# Patient Record
Sex: Female | Born: 1960 | Race: Black or African American | Hispanic: No | Marital: Married | Smoking: Never smoker
Health system: Southern US, Community
[De-identification: ages and names within clinical notes are randomized; demographics above are authoritative.]

## PROBLEM LIST (undated history)

## (undated) HISTORY — PX: ABDOMINAL HYSTERECTOMY: SHX81

---

## 2012-08-18 ENCOUNTER — Emergency Department: Payer: Self-pay | Admitting: Emergency Medicine

## 2012-08-18 LAB — CBC
HGB: 13.4 g/dL (ref 12.0–16.0)
MCV: 88 fL (ref 80–100)
Platelet: 280 10*3/uL (ref 150–440)
RBC: 4.43 10*6/uL (ref 3.80–5.20)
RDW: 13.8 % (ref 11.5–14.5)
WBC: 5.4 10*3/uL (ref 3.6–11.0)

## 2012-08-18 LAB — COMPREHENSIVE METABOLIC PANEL
Alkaline Phosphatase: 71 U/L (ref 50–136)
Anion Gap: 13 (ref 7–16)
BUN: 4 mg/dL — ABNORMAL LOW (ref 7–18)
Bilirubin,Total: 0.4 mg/dL (ref 0.2–1.0)
Chloride: 102 mmol/L (ref 98–107)
Co2: 23 mmol/L (ref 21–32)
EGFR (African American): >60
SGOT(AST): 29 U/L (ref 15–37)
SGPT (ALT): 37 U/L (ref 12–78)
Sodium: 138 mmol/L (ref 136–145)
Total Protein: 7.6 g/dL (ref 6.4–8.2)

## 2012-08-18 LAB — URINALYSIS, COMPLETE
Bilirubin,UR: NEGATIVE
Glucose,UR: NEGATIVE mg/dL (ref 0–75)
Leukocyte Esterase: NEGATIVE
Nitrite: NEGATIVE
Ph: 5 (ref 4.5–8.0)
RBC,UR: <1 /HPF (ref 0–5)
Specific Gravity: 1.029 (ref 1.003–1.030)
Squamous Epithelial: 32

## 2012-11-17 ENCOUNTER — Emergency Department: Payer: Self-pay | Admitting: Emergency Medicine

## 2012-11-17 LAB — URINALYSIS, COMPLETE
Bilirubin,UR: NEGATIVE
Glucose,UR: NEGATIVE mg/dL (ref 0–75)
Nitrite: NEGATIVE
Ph: 8 (ref 4.5–8.0)
Protein: NEGATIVE
RBC,UR: 1 /HPF (ref 0–5)
Squamous Epithelial: 10
WBC UR: 2 /HPF (ref 0–5)

## 2012-11-17 LAB — COMPREHENSIVE METABOLIC PANEL
Albumin: 3.5 g/dL (ref 3.4–5.0)
Alkaline Phosphatase: 83 U/L (ref 50–136)
Anion Gap: 7 (ref 7–16)
BUN: 6 mg/dL — ABNORMAL LOW (ref 7–18)
Calcium, Total: 9.1 mg/dL (ref 8.5–10.1)
Chloride: 103 mmol/L (ref 98–107)
Creatinine: 0.85 mg/dL (ref 0.60–1.30)
EGFR (African American): >60
EGFR (Non-African Amer.): >60
Glucose: 131 mg/dL — ABNORMAL HIGH (ref 65–99)
Osmolality: 271 (ref 275–301)
SGOT(AST): 27 U/L (ref 15–37)
SGPT (ALT): 23 U/L (ref 12–78)
Sodium: 136 mmol/L (ref 136–145)
Total Protein: 7.7 g/dL (ref 6.4–8.2)

## 2012-11-17 LAB — LIPASE, BLOOD: Lipase: 110 U/L (ref 73–393)

## 2012-11-17 LAB — CBC
HGB: 13.2 g/dL (ref 12.0–16.0)
MCH: 30.3 pg (ref 26.0–34.0)
RBC: 4.37 10*6/uL (ref 3.80–5.20)

## 2013-11-30 ENCOUNTER — Emergency Department: Payer: Self-pay | Admitting: Emergency Medicine

## 2013-11-30 LAB — COMPREHENSIVE METABOLIC PANEL
ALT: 21 U/L
ANION GAP: 8 (ref 7–16)
AST: 19 U/L (ref 15–37)
Albumin: 3.4 g/dL (ref 3.4–5.0)
Alkaline Phosphatase: 75 U/L
BUN: 16 mg/dL (ref 7–18)
Bilirubin,Total: 0.4 mg/dL (ref 0.2–1.0)
CREATININE: 0.79 mg/dL (ref 0.60–1.30)
Calcium, Total: 8.2 mg/dL — ABNORMAL LOW (ref 8.5–10.1)
Chloride: 104 mmol/L (ref 98–107)
Co2: 27 mmol/L (ref 21–32)
EGFR (African American): >60
Glucose: 104 mg/dL — ABNORMAL HIGH (ref 65–99)
Osmolality: 279 (ref 275–301)
POTASSIUM: 3.7 mmol/L (ref 3.5–5.1)
Sodium: 139 mmol/L (ref 136–145)
Total Protein: 7.8 g/dL (ref 6.4–8.2)

## 2013-11-30 LAB — LIPASE, BLOOD: Lipase: 111 U/L (ref 73–393)

## 2013-11-30 LAB — CBC WITH DIFFERENTIAL/PLATELET
Basophil #: 0 10*3/uL (ref 0.0–0.1)
Basophil %: 0.4 %
EOS ABS: 0 10*3/uL (ref 0.0–0.7)
Eosinophil %: 0 %
HCT: 40.2 % (ref 35.0–47.0)
HGB: 13.1 g/dL (ref 12.0–16.0)
Lymphocyte #: 1.2 10*3/uL (ref 1.0–3.6)
Lymphocyte %: 19 %
MCH: 29.4 pg (ref 26.0–34.0)
MCHC: 32.6 g/dL (ref 32.0–36.0)
MCV: 90 fL (ref 80–100)
Monocyte #: 0.4 x10 3/mm (ref 0.2–0.9)
Monocyte %: 5.9 %
Neutrophil #: 4.6 10*3/uL (ref 1.4–6.5)
Neutrophil %: 74.7 %
Platelet: 255 10*3/uL (ref 150–440)
RBC: 4.46 10*6/uL (ref 3.80–5.20)
RDW: 13.5 % (ref 11.5–14.5)
WBC: 6.2 10*3/uL (ref 3.6–11.0)

## 2013-12-15 ENCOUNTER — Emergency Department: Payer: Self-pay | Admitting: Student

## 2013-12-15 LAB — URINALYSIS, COMPLETE
Bilirubin,UR: NEGATIVE
GLUCOSE, UR: NEGATIVE mg/dL (ref 0–75)
Nitrite: NEGATIVE
PH: 5 (ref 4.5–8.0)
Protein: 100
RBC,UR: 10 /HPF (ref 0–5)
Specific Gravity: 1.036 (ref 1.003–1.030)
Squamous Epithelial: 14
WBC UR: 19 /HPF (ref 0–5)

## 2013-12-15 LAB — CBC WITH DIFFERENTIAL/PLATELET
Basophil #: 0 10*3/uL (ref 0.0–0.1)
Basophil %: 0.6 %
EOS PCT: 0 %
Eosinophil #: 0 10*3/uL (ref 0.0–0.7)
HCT: 42.7 % (ref 35.0–47.0)
HGB: 13.8 g/dL (ref 12.0–16.0)
LYMPHS ABS: 1.2 10*3/uL (ref 1.0–3.6)
Lymphocyte %: 18.1 %
MCH: 28.9 pg (ref 26.0–34.0)
MCHC: 32.2 g/dL (ref 32.0–36.0)
MCV: 90 fL (ref 80–100)
MONO ABS: 0.5 x10 3/mm (ref 0.2–0.9)
Monocyte %: 6.8 %
Neutrophil #: 5 10*3/uL (ref 1.4–6.5)
Neutrophil %: 74.5 %
Platelet: 310 10*3/uL (ref 150–440)
RBC: 4.77 10*6/uL (ref 3.80–5.20)
RDW: 13.5 % (ref 11.5–14.5)
WBC: 6.7 10*3/uL (ref 3.6–11.0)

## 2013-12-15 LAB — COMPREHENSIVE METABOLIC PANEL
ALBUMIN: 3.6 g/dL (ref 3.4–5.0)
ALK PHOS: 76 U/L
AST: 19 U/L (ref 15–37)
Anion Gap: 12 (ref 7–16)
BILIRUBIN TOTAL: 0.4 mg/dL (ref 0.2–1.0)
BUN: 17 mg/dL (ref 7–18)
CALCIUM: 8.9 mg/dL (ref 8.5–10.1)
CHLORIDE: 98 mmol/L (ref 98–107)
CO2: 26 mmol/L (ref 21–32)
Creatinine: 0.83 mg/dL (ref 0.60–1.30)
EGFR (African American): >60
Glucose: 99 mg/dL (ref 65–99)
OSMOLALITY: 274 (ref 275–301)
POTASSIUM: 3.5 mmol/L (ref 3.5–5.1)
SGPT (ALT): 15 U/L
Sodium: 136 mmol/L (ref 136–145)
Total Protein: 8.4 g/dL — ABNORMAL HIGH (ref 6.4–8.2)

## 2013-12-15 LAB — LIPASE, BLOOD: Lipase: 117 U/L (ref 73–393)

## 2013-12-17 LAB — URINE CULTURE

## 2016-07-15 ENCOUNTER — Encounter: Payer: Self-pay | Admitting: Emergency Medicine

## 2016-07-15 ENCOUNTER — Emergency Department
Admission: EM | Admit: 2016-07-15 | Discharge: 2016-07-15 | Disposition: A | Discharge status: Home / Self Care | Payer: BLUE CROSS/BLUE SHIELD | Attending: Emergency Medicine | Admitting: Emergency Medicine

## 2016-07-15 ENCOUNTER — Emergency Department: Payer: BLUE CROSS/BLUE SHIELD

## 2016-07-15 DIAGNOSIS — M545 Low back pain: Secondary | ICD-10-CM | POA: Diagnosis present

## 2016-07-15 DIAGNOSIS — N3090 Cystitis, unspecified without hematuria: Secondary | ICD-10-CM

## 2016-07-15 DIAGNOSIS — N3000 Acute cystitis without hematuria: Secondary | ICD-10-CM | POA: Insufficient documentation

## 2016-07-15 LAB — CBC
HEMATOCRIT: 38.2 % (ref 35.0–47.0)
HEMOGLOBIN: 13.1 g/dL (ref 12.0–16.0)
MCH: 31.1 pg (ref 26.0–34.0)
MCHC: 34.3 g/dL (ref 32.0–36.0)
MCV: 90.8 fL (ref 80.0–100.0)
Platelets: 277 10*3/uL (ref 150–440)
RBC: 4.21 MIL/uL (ref 3.80–5.20)
RDW: 13.4 % (ref 11.5–14.5)
WBC: 5.3 10*3/uL (ref 3.6–11.0)

## 2016-07-15 LAB — COMPREHENSIVE METABOLIC PANEL
ALBUMIN: 3.8 g/dL (ref 3.5–5.0)
ALT: 14 U/L (ref 14–54)
ANION GAP: 10 (ref 5–15)
AST: 19 U/L (ref 15–41)
Alkaline Phosphatase: 48 U/L (ref 38–126)
BUN: 7 mg/dL (ref 6–20)
CHLORIDE: 103 mmol/L (ref 101–111)
CO2: 25 mmol/L (ref 22–32)
Calcium: 8.9 mg/dL (ref 8.9–10.3)
Creatinine, Ser: 0.7 mg/dL (ref 0.44–1.00)
GFR calc non Af Amer: >60 mL/min (ref >60)
GLUCOSE: 109 mg/dL — AB (ref 65–99)
POTASSIUM: 3.5 mmol/L (ref 3.5–5.1)
SODIUM: 138 mmol/L (ref 135–145)
Total Bilirubin: 0.8 mg/dL (ref 0.3–1.2)
Total Protein: 7.4 g/dL (ref 6.5–8.1)

## 2016-07-15 LAB — URINALYSIS, COMPLETE (UACMP) WITH MICROSCOPIC
Bilirubin Urine: NEGATIVE
GLUCOSE, UA: NEGATIVE mg/dL
KETONES UR: 20 mg/dL — AB
Leukocytes, UA: NEGATIVE
Nitrite: NEGATIVE
PROTEIN: 100 mg/dL — AB
Specific Gravity, Urine: 1.028 (ref 1.005–1.030)
pH: 5 (ref 5.0–8.0)

## 2016-07-15 LAB — LIPASE, BLOOD: LIPASE: 27 U/L (ref 11–51)

## 2016-07-15 MED ORDER — MORPHINE SULFATE (PF) 4 MG/ML IV SOLN
4.0000 mg | Freq: Once | INTRAVENOUS | Status: AC
  Administered 2016-07-15: 4 mg via INTRAVENOUS
  Filled 2016-07-15: qty 1

## 2016-07-15 MED ORDER — DEXTROSE 5 % IV SOLN
1.0000 g | Freq: Once | INTRAVENOUS | Status: AC
  Administered 2016-07-15: 1 g via INTRAVENOUS
  Filled 2016-07-15: qty 10

## 2016-07-15 MED ORDER — PHENAZOPYRIDINE HCL 200 MG PO TABS
200.0000 mg | ORAL_TABLET | Freq: Once | ORAL | Status: AC
  Administered 2016-07-15: 200 mg via ORAL
  Filled 2016-07-15: qty 1

## 2016-07-15 MED ORDER — SODIUM CHLORIDE 0.9 % IV SOLN
Freq: Once | INTRAVENOUS | Status: AC
  Administered 2016-07-15: 08:00:00 via INTRAVENOUS

## 2016-07-15 MED ORDER — PHENAZOPYRIDINE HCL 200 MG PO TABS: 200.0000 mg | ORAL_TABLET | Freq: Three times a day (TID) | ORAL | 0 refills | Status: AC | PRN

## 2016-07-15 MED ORDER — CIPROFLOXACIN HCL 500 MG PO TABS: 500.0000 mg | ORAL_TABLET | Freq: Two times a day (BID) | ORAL | 0 refills | Status: AC

## 2016-07-15 MED ORDER — KETOROLAC TROMETHAMINE 30 MG/ML IJ SOLN
30.0000 mg | Freq: Once | INTRAMUSCULAR | Status: AC
  Administered 2016-07-15: 30 mg via INTRAVENOUS
  Filled 2016-07-15: qty 1

## 2016-07-15 MED ORDER — ONDANSETRON HCL 4 MG/2ML IJ SOLN
4.0000 mg | Freq: Once | INTRAMUSCULAR | Status: AC
  Administered 2016-07-15: 4 mg via INTRAVENOUS
  Filled 2016-07-15: qty 2

## 2016-07-15 MED ORDER — TRAMADOL HCL 50 MG PO TABS: 50.0000 mg | ORAL_TABLET | Freq: Four times a day (QID) | ORAL | 0 refills | Status: AC | PRN

## 2016-07-15 NOTE — ED Triage Notes (Signed)
Pt presents to ED with c/o lower back pain and lower abd pain for the past week. Also reports urinary frequency. Hx of the same and reports las time her symptoms were due to a UTI. Denies fever or vomiting.

## 2016-07-15 NOTE — ED Provider Notes (Signed)
[facility] Emergency Department Provider Note       Time seen: ----------------------------------------- 7:48 AM on 07/15/2016 -----------------------------------------     I have reviewed the triage vital signs and the nursing notes.   HISTORY   Chief Complaint Abdominal Pain and Back Pain    HPI Pura SpiceBonita Schuff is a 56 y.o. female who presents to the ED for lower back pain as well as lower abdominal pain and dysuria for the past week. Patient has urinary frequency as well, states last time she felt this way she had a UTI. She denies fever but has had chills, has had nausea but no vomiting. She denies other complaints at this time.   History reviewed. No pertinent past medical history.  There are no active problems to display for this patient.   History reviewed. No pertinent surgical history.  Allergies Patient has no known allergies.  Social History Social History  Substance Use Topics  . Smoking status: Never Smoker  . Smokeless tobacco: Never Used  . Alcohol use No    Review of Systems Constitutional: Negative for fever.Positive for chills Cardiovascular: Negative for chest pain. Respiratory: Negative for shortness of breath. Gastrointestinal: Positive for abdominal pain, positive for nausea Genitourinary: Negative for dysuria. Musculoskeletal: Positive for back pain Skin: Negative for rash. Neurological: Negative for headaches, focal weakness or numbness.  All systems negative/normal/unremarkable except as stated in the HPI  ____________________________________________   PHYSICAL EXAM:  VITAL SIGNS: ED Triage Vitals  Enc Vitals Group     BP 07/15/16 0651 117/83     Pulse Rate 07/15/16 0651 (!) 108     Resp 07/15/16 0651 18     Temp 07/15/16 0651 98.8 F (37.1 C)     Temp Source 07/15/16 0651 Oral     SpO2 07/15/16 0651 98 %     Weight 07/15/16 0652 180 lb (81.6 kg)     Height 07/15/16 0652 5\' 5"  (1.651 m)     Head  Circumference --      Peak Flow --      Pain Score 07/15/16 0651 6     Pain Loc --      Pain Edu? --      Excl. in GC? --     Constitutional: Alert and oriented. Well appearing and in no distress. Eyes: Conjunctivae are normal. Normal extraocular movements. ENT   Head: Normocephalic and atraumatic.   Nose: No congestion/rhinnorhea.   Mouth/Throat: Mucous membranes are moist.   Neck: No stridor. Cardiovascular: Normal rate, regular rhythm. No murmurs, rubs, or gallops. Respiratory: Normal respiratory effort without tachypnea nor retractions. Breath sounds are clear and equal bilaterally. No wheezes/rales/rhonchi. Gastrointestinal: Soft and nontender. Normal bowel sounds. No specific CVA tenderness Musculoskeletal: Nontender with normal range of motion in extremities. No lower extremity tenderness nor edema. Neurologic:  Normal speech and language. No gross focal neurologic deficits are appreciated.  Skin:  Skin is warm, dry and intact. No rash noted. Psychiatric: Mood and affect are normal. Speech and behavior are normal.  ____________________________________________  ED COURSE:  Pertinent labs & imaging results that were available during my care of the patient were reviewed by me and considered in my medical decision making (see chart for details). Patient presents for abdominal pain as well as back pain, we will assess with labs and imaging as indicated.   Procedures ____________________________________________   LABS (pertinent positives/negatives)  Labs Reviewed  COMPREHENSIVE METABOLIC PANEL - Abnormal; Notable for the following:  Result Value   Glucose, Bld 109 (*)    All other components within normal limits  URINALYSIS, COMPLETE (UACMP) WITH MICROSCOPIC - Abnormal; Notable for the following:    Color, Urine AMBER (*)    APPearance CLOUDY (*)    Hgb urine dipstick LARGE (*)    Ketones, ur 20 (*)    Protein, ur 100 (*)    All other components within  normal limits  LIPASE, BLOOD  CBC    RADIOLOGY Images reviewed by me Renal ultrasound IMPRESSION: 1. Suggestion of a 3.5 mm nonobstructive stone in the left kidney. No other acute abnormalities. ____________________________________________  FINAL ASSESSMENT AND PLAN  Cystitis  Plan: Patient's labs and imaging were dictated above. Patient had presented for back pain and symptoms of a UTI. No hydronephrosis or extra renal stone was visualized on ultrasound. We have started her on IV antibiotics and sent a urine culture. She is stable for outpatient follow-up.   Emily Filbert, MD   Note: This note was generated in part or whole with voice recognition software. Voice recognition is usually quite accurate but there are transcription errors that can and very often do occur. I apologize for any typographical errors that were not detected and corrected.     Emily Filbert, MD 07/15/16 [phone]

## 2016-07-16 LAB — URINE CULTURE: Special Requests: NORMAL

## 2016-08-13 ENCOUNTER — Emergency Department
Admission: EM | Admit: 2016-08-13 | Discharge: 2016-08-13 | Disposition: A | Discharge status: Home / Self Care | Payer: BLUE CROSS/BLUE SHIELD | Attending: Emergency Medicine | Admitting: Emergency Medicine

## 2016-08-13 ENCOUNTER — Encounter: Payer: Self-pay | Admitting: Emergency Medicine

## 2016-08-13 DIAGNOSIS — R1030 Lower abdominal pain, unspecified: Secondary | ICD-10-CM | POA: Insufficient documentation

## 2016-08-13 DIAGNOSIS — R112 Nausea with vomiting, unspecified: Secondary | ICD-10-CM | POA: Diagnosis not present

## 2016-08-13 DIAGNOSIS — Z5321 Procedure and treatment not carried out due to patient leaving prior to being seen by health care provider: Secondary | ICD-10-CM | POA: Diagnosis not present

## 2016-08-13 LAB — CBC
HEMATOCRIT: 38.8 % (ref 35.0–47.0)
HEMOGLOBIN: 13.1 g/dL (ref 12.0–16.0)
MCH: 30.7 pg (ref 26.0–34.0)
MCHC: 33.9 g/dL (ref 32.0–36.0)
MCV: 90.5 fL (ref 80.0–100.0)
Platelets: 269 10*3/uL (ref 150–440)
RBC: 4.29 MIL/uL (ref 3.80–5.20)
RDW: 12.9 % (ref 11.5–14.5)
WBC: 4.8 10*3/uL (ref 3.6–11.0)

## 2016-08-13 LAB — URINALYSIS, COMPLETE (UACMP) WITH MICROSCOPIC
Bilirubin Urine: NEGATIVE
GLUCOSE, UA: NEGATIVE mg/dL
KETONES UR: NEGATIVE mg/dL
Leukocytes, UA: NEGATIVE
NITRITE: NEGATIVE
PROTEIN: 30 mg/dL — AB
Specific Gravity, Urine: 1.016 (ref 1.005–1.030)
pH: 8 (ref 5.0–8.0)

## 2016-08-13 LAB — COMPREHENSIVE METABOLIC PANEL
ALBUMIN: 3.5 g/dL (ref 3.5–5.0)
ALK PHOS: 52 U/L (ref 38–126)
ALT: 11 U/L — ABNORMAL LOW (ref 14–54)
ANION GAP: 9 (ref 5–15)
AST: 17 U/L (ref 15–41)
BUN: 6 mg/dL (ref 6–20)
CALCIUM: 9.1 mg/dL (ref 8.9–10.3)
CO2: 26 mmol/L (ref 22–32)
Chloride: 102 mmol/L (ref 101–111)
Creatinine, Ser: 0.5 mg/dL (ref 0.44–1.00)
GFR calc non Af Amer: >60 mL/min (ref >60)
GLUCOSE: 117 mg/dL — AB (ref 65–99)
POTASSIUM: 3.5 mmol/L (ref 3.5–5.1)
SODIUM: 137 mmol/L (ref 135–145)
Total Bilirubin: 0.5 mg/dL (ref 0.3–1.2)
Total Protein: 7.2 g/dL (ref 6.5–8.1)

## 2016-08-13 LAB — LIPASE, BLOOD: LIPASE: 49 U/L (ref 11–51)

## 2016-08-13 NOTE — ED Triage Notes (Signed)
Pt to ED c/o bilateral lower abd pain. Pt states that the pain started today. Pain is similar to the pain she had 4 weeks ago when she was diagnosed with a UTI. Pt states that she is also having N/V.

## 2016-08-15 ENCOUNTER — Telehealth: Payer: Self-pay | Admitting: Emergency Medicine

## 2016-08-15 NOTE — Telephone Encounter (Signed)
Called patient due to lwot to inquire about condition and follow up plans. Left message.   

## 2016-08-19 ENCOUNTER — Encounter: Payer: Self-pay | Admitting: Radiology

## 2016-08-19 ENCOUNTER — Emergency Department
Admission: EM | Admit: 2016-08-19 | Discharge: 2016-08-19 | Disposition: A | Discharge status: Home / Self Care | Payer: BLUE CROSS/BLUE SHIELD | Attending: Emergency Medicine | Admitting: Emergency Medicine

## 2016-08-19 ENCOUNTER — Emergency Department: Payer: BLUE CROSS/BLUE SHIELD

## 2016-08-19 DIAGNOSIS — R112 Nausea with vomiting, unspecified: Secondary | ICD-10-CM | POA: Insufficient documentation

## 2016-08-19 DIAGNOSIS — N3091 Cystitis, unspecified with hematuria: Secondary | ICD-10-CM | POA: Diagnosis not present

## 2016-08-19 DIAGNOSIS — R1030 Lower abdominal pain, unspecified: Secondary | ICD-10-CM | POA: Insufficient documentation

## 2016-08-19 DIAGNOSIS — N3090 Cystitis, unspecified without hematuria: Secondary | ICD-10-CM

## 2016-08-19 LAB — URINALYSIS, COMPLETE (UACMP) WITH MICROSCOPIC
Bilirubin Urine: NEGATIVE
Glucose, UA: NEGATIVE mg/dL
Ketones, ur: NEGATIVE mg/dL
Leukocytes, UA: NEGATIVE
Nitrite: NEGATIVE
Protein, ur: NEGATIVE mg/dL
SPECIFIC GRAVITY, URINE: 1.01 (ref 1.005–1.030)
pH: 7 (ref 5.0–8.0)

## 2016-08-19 LAB — COMPREHENSIVE METABOLIC PANEL
ALBUMIN: 3.7 g/dL (ref 3.5–5.0)
ALT: 11 U/L — ABNORMAL LOW (ref 14–54)
AST: 16 U/L (ref 15–41)
Alkaline Phosphatase: 63 U/L (ref 38–126)
Anion gap: 8 (ref 5–15)
BILIRUBIN TOTAL: 0.4 mg/dL (ref 0.3–1.2)
BUN: 7 mg/dL (ref 6–20)
CHLORIDE: 104 mmol/L (ref 101–111)
CO2: 26 mmol/L (ref 22–32)
Calcium: 8.9 mg/dL (ref 8.9–10.3)
Creatinine, Ser: 0.67 mg/dL (ref 0.44–1.00)
GFR calc Af Amer: >60 mL/min (ref >60)
GFR calc non Af Amer: >60 mL/min (ref >60)
GLUCOSE: 106 mg/dL — AB (ref 65–99)
POTASSIUM: 3.7 mmol/L (ref 3.5–5.1)
SODIUM: 138 mmol/L (ref 135–145)
Total Protein: 7.2 g/dL (ref 6.5–8.1)

## 2016-08-19 LAB — CBC
HEMATOCRIT: 38.1 % (ref 35.0–47.0)
HEMOGLOBIN: 12.9 g/dL (ref 12.0–16.0)
MCH: 30 pg (ref 26.0–34.0)
MCHC: 33.8 g/dL (ref 32.0–36.0)
MCV: 88.9 fL (ref 80.0–100.0)
Platelets: 260 10*3/uL (ref 150–440)
RBC: 4.29 MIL/uL (ref 3.80–5.20)
RDW: 13.4 % (ref 11.5–14.5)
WBC: 4 10*3/uL (ref 3.6–11.0)

## 2016-08-19 MED ORDER — MORPHINE SULFATE (PF) 4 MG/ML IV SOLN
4.0000 mg | Freq: Once | INTRAVENOUS | Status: AC
  Administered 2016-08-19: 4 mg via INTRAVENOUS
  Filled 2016-08-19: qty 1

## 2016-08-19 MED ORDER — SODIUM CHLORIDE 0.9 % IV BOLUS (SEPSIS)
1000.0000 mL | Freq: Once | INTRAVENOUS | Status: AC
  Administered 2016-08-19: 1000 mL via INTRAVENOUS

## 2016-08-19 MED ORDER — CEPHALEXIN 500 MG PO CAPS: 500.0000 mg | ORAL_CAPSULE | Freq: Two times a day (BID) | ORAL | 0 refills | Status: AC

## 2016-08-19 MED ORDER — ONDANSETRON HCL 4 MG/2ML IJ SOLN
4.0000 mg | Freq: Once | INTRAMUSCULAR | Status: AC
  Administered 2016-08-19: 4 mg via INTRAVENOUS
  Filled 2016-08-19: qty 2

## 2016-08-19 MED ORDER — IOPAMIDOL (ISOVUE-300) INJECTION 61%
30.0000 mL | Freq: Once | INTRAVENOUS | Status: AC | PRN
  Administered 2016-08-19: 30 mL via ORAL

## 2016-08-19 MED ORDER — IOPAMIDOL (ISOVUE-300) INJECTION 61%
100.0000 mL | Freq: Once | INTRAVENOUS | Status: AC | PRN
  Administered 2016-08-19: 100 mL via INTRAVENOUS

## 2016-08-19 MED ORDER — ONDANSETRON 4 MG PO TBDP: 4.0000 mg | ORAL_TABLET | Freq: Three times a day (TID) | ORAL | 0 refills | Status: AC | PRN

## 2016-08-19 NOTE — ED Notes (Signed)
Patient verbalizes understanding of d/c instructions and follow-up. VS stable and pain controlled per patient.  Patient in NAD at time of d/c and denies further concerns regarding this visit. Patient stable at the time of departure from the unit, departing unit by the safest and most appropriate manner per that patients condition and limitations. Patient advised to return to the ED at any time for emergent concerns, or for new/worsening symptoms.    Patient refused wheel chair out  

## 2016-08-19 NOTE — ED Triage Notes (Signed)
Pt states bilateral lower quadrant pain that radiates to lower back for a week. Pt states she has had nausea, no known fever, no diarrhea. Pt states she was seen a week ago for upper quadrant abd pain, but left before diagnosis.

## 2016-08-19 NOTE — ED Provider Notes (Signed)
[facility] Emergency Department Provider Note  Time seen: 7:48 AM  I have reviewed the triage vital signs and the nursing notes.   HISTORY  Chief Complaint Abdominal Pain and Back Pain    HPI Sabrina Mccann is a 56 y.o. female who presents to the emergency department for lower abdominal pain nausea and vomiting. According to the patient for the past one month she has been extremity intermittent lower abdominal pain. She was seen here 1 month ago for the same was diagnosed with possible urinary tract infection and was discharged on antibiotics. Patient states her discomfort went away however approximate one or 2 weeks ago it returned along with nausea and vomiting this morning. Patient describes her lower abdominal pain as an 8/10. Denies any dysuria but states she has noticed occasional blood in her urine. She states the lower abdominal pain radiates to her lower back. Denies any lateralizing symptoms, states her pain is in the midline. Denies any weakness or numbness. Denies any fever. Denies any chest pain or shortness of breath. Patient currently describes the pain as an 8/10 dull aching pain in her lower abdomen.  No past medical history on file.  There are no active problems to display for this patient.   No past surgical history on file.  Prior to Admission medications   Medication Sig Start Date End Date Taking? Authorizing Provider  phenazopyridine (PYRIDIUM) 200 MG tablet Take 1 tablet (200 mg total) by mouth 3 (three) times daily as needed for pain. 07/15/16 07/15/17  Emily Filbert, MD  traMADol (ULTRAM) 50 MG tablet Take 1 tablet (50 mg total) by mouth every 6 (six) hours as needed. 07/15/16 07/15/17  Emily Filbert, MD    No Known Allergies  No family history on file.  Social History Social History  Substance Use Topics  . Smoking status: Never Smoker  . Smokeless tobacco: Never Used  . Alcohol use No    Review of  Systems Constitutional: Negative for fever. Cardiovascular: Negative for chest pain. Respiratory: Negative for shortness of breath. Gastrointestinal: Lower abdominal pain. Positive for nausea and vomiting. Negative for diarrhea. Genitourinary: Negative for dysuria. States occasional hematuria. Denies vaginal bleeding or discharge. Musculoskeletal: Lower back pain. Neurological: Negative for headache All other ROS negative  ____________________________________________   PHYSICAL EXAM:  VITAL SIGNS: ED Triage Vitals [08/19/16 0650]  Enc Vitals Group     BP (!) 146/77     Pulse Rate (!) 112     Resp 18     Temp 99 F (37.2 C)     Temp src      SpO2 100 %     Weight 180 lb (81.6 kg)     Height 5\' 5"  (1.651 m)     Head Circumference      Peak Flow      Pain Score 8     Pain Loc      Pain Edu?      Excl. in GC?     Constitutional: Alert and oriented. Well appearing and in no distress. Eyes: Normal exam ENT   Head: Normocephalic and atraumatic.   Mouth/Throat: Mucous membranes are moist. Cardiovascular: Normal rate, regular rhythm. No murmur Respiratory: Normal respiratory effort without tachypnea nor retractions. Breath sounds are clear  Gastrointestinal: Soft, moderate suprapubic tenderness palpation. No rebound or guarding. No distention. Musculoskeletal: Nontender with normal range of motion in all extremities.  Neurologic:  Normal speech and language. No gross focal neurologic deficits  Skin:  Skin is warm, dry and intact.  Psychiatric: Mood and affect are normal.  ____________________________________________     RADIOLOGY  IMPRESSION: Suggestion of mild gallbladder sludge versus subtle gallstones with mild ill definition of the gallbladder wall without adjacent fluid or inflammatory change. Consider right upper quadrant ultrasound for further evaluation.  Subcentimeter hypodensity over the right kidney too small to characterize, but likely a  cyst.  ____________________________________________   INITIAL IMPRESSION / ASSESSMENT AND PLAN / ED COURSE  Pertinent labs & imaging results that were available during my care of the patient were reviewed by me and considered in my medical decision making (see chart for details).  Patient presents to the emergency department for lower abdominal discomfort and nausea with vomiting this morning. Patient does have moderate suprapubic tenderness to palpation. Patient's labs are largely within normal limits including a normal white blood cell count. Urinalysis does shows 6-30 RBCs but no WBCs. We will send a urine culture. We'll obtain a CT scan of the abdomen/pelvis to further evaluate.  Patient's CT shows gallbladder sludge. I discussed this with the patient however given her normal LFTs and S her pain is in the lower abdomen I doubt this is related to her current complaint. However patient would like it evaluated we will proceed with an ultrasound to further evaluate. Given the patient's continued mild hematuria we will likely cover his urinary tract infection with Keflex. Patient was last placed on ciprofloxacin. I sent a urine culture. Patient agreeable to this plan of care.  Ultrasound shows small stones and sludge with no accompanying factors. Patient will be discharged home with Keflex and PCP follow-up. Patient agreeable to plan.  ____________________________________________   FINAL CLINICAL IMPRESSION(S) / ED DIAGNOSES  Lower abdominal pain Cystitis   Minna AntisPaduchowski, Sanjeev Main, MD 08/19/16 1133

## 2016-08-21 LAB — URINE CULTURE

## 2019-01-21 IMAGING — CT CT ABD-PELV W/ CM
2 of 5 series · 15 of 46 positions shown, 17 images · IV contrast (APPLIED)
Comparison: None.

CLINICAL DATA: Abdominal/ pelvic pain 6 months with nausea and
vomiting with symptoms worse over the past week.

EXAM:
CT ABDOMEN AND PELVIS WITH CONTRAST
TECHNIQUE: Multidetector CT imaging of the abdomen and pelvis was performed
using the standard protocol following bolus administration of
intravenous contrast.
CONTRAST:  100mL [id number] IOPAMIDOL ([id number]) INJECTION 61%

[Series 2: routine abd/pel with · axial · 0.70mm/px · z∈[-830,-375]mm · 12 of 103 slices shown, 14 images]
[im 6/103  soft-tissue]
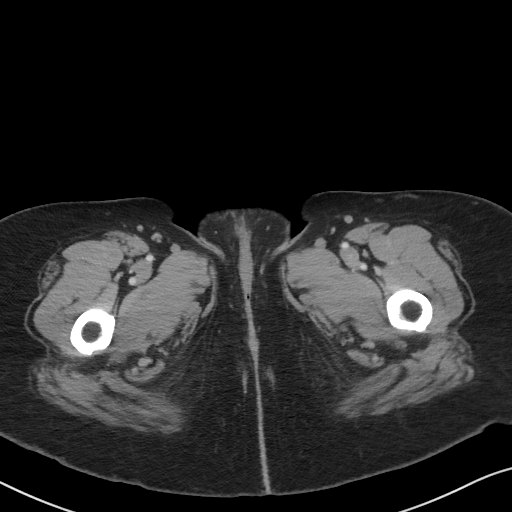
[im 6/103  bone]
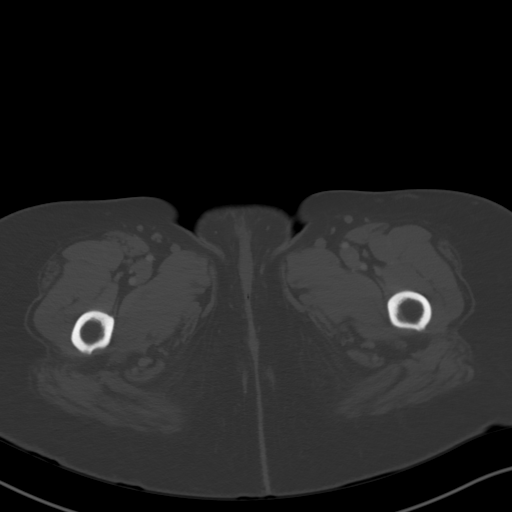
[im 18/103  soft-tissue]
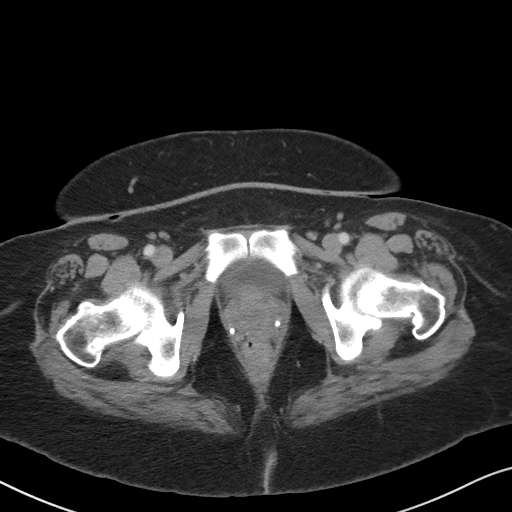
[im 23/103  soft-tissue]
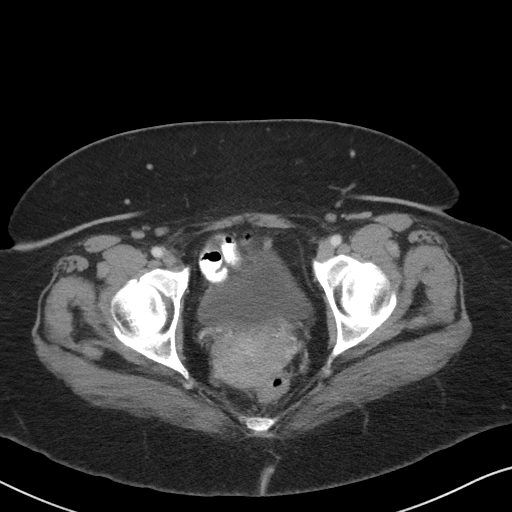
[im 29/103  soft-tissue]
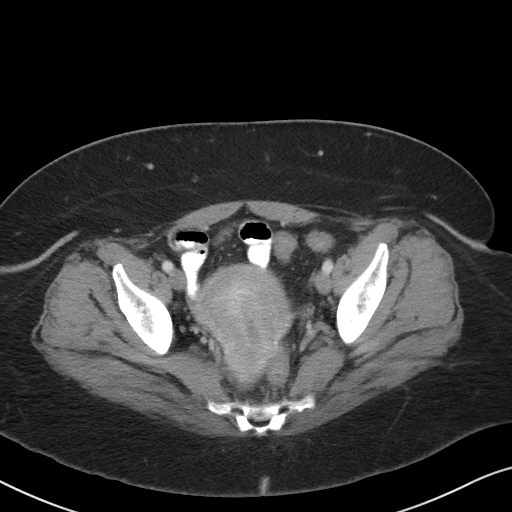
[im 40/103  soft-tissue]
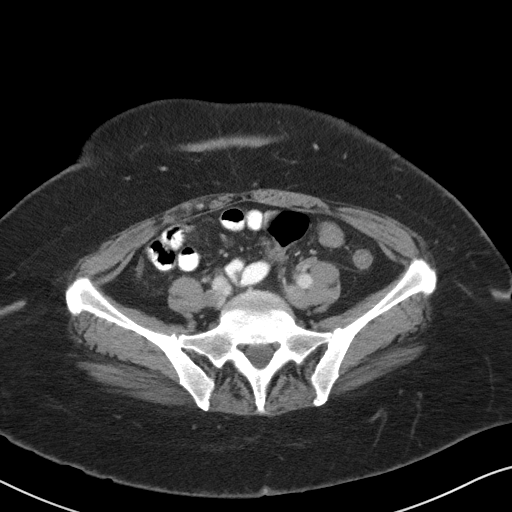
[im 46/103  soft-tissue]
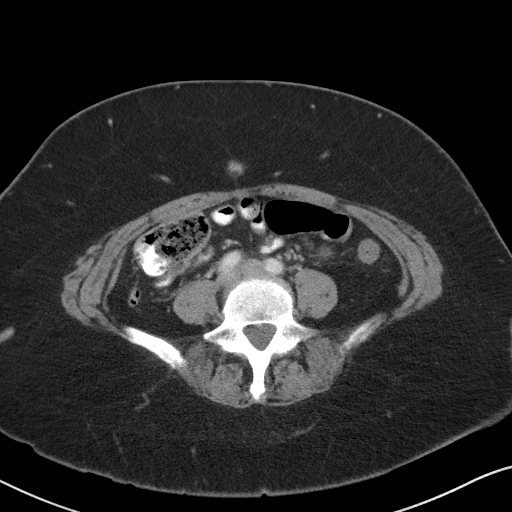
[im 57/103  soft-tissue]
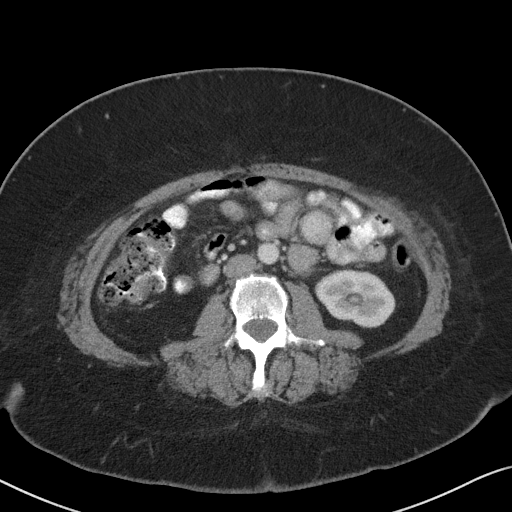
[im 63/103  soft-tissue]
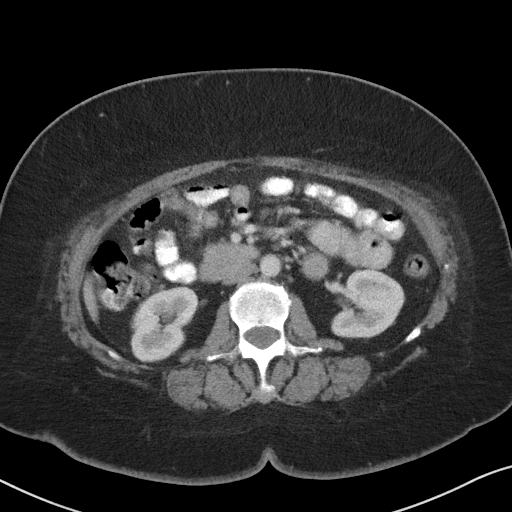
[im 74/103  soft-tissue]
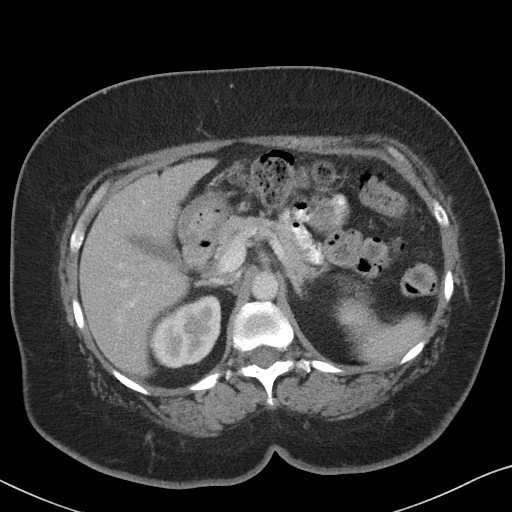
[im 74/103  bone]
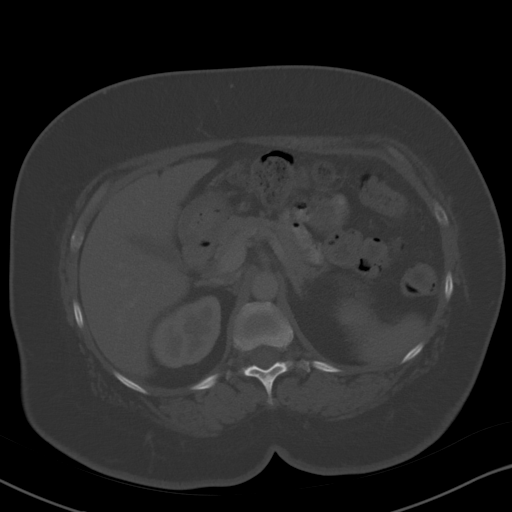
[im 80/103  soft-tissue]
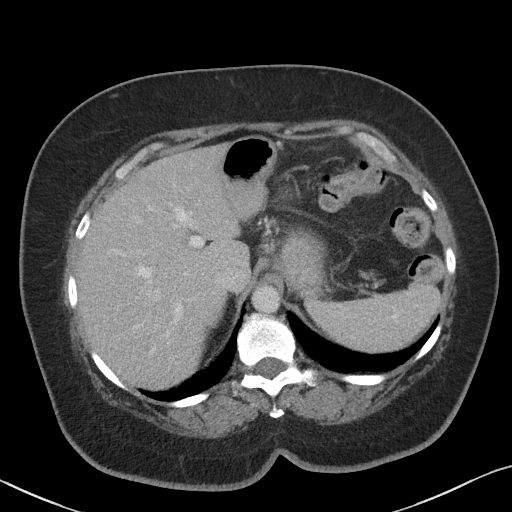
[im 86/103  soft-tissue]
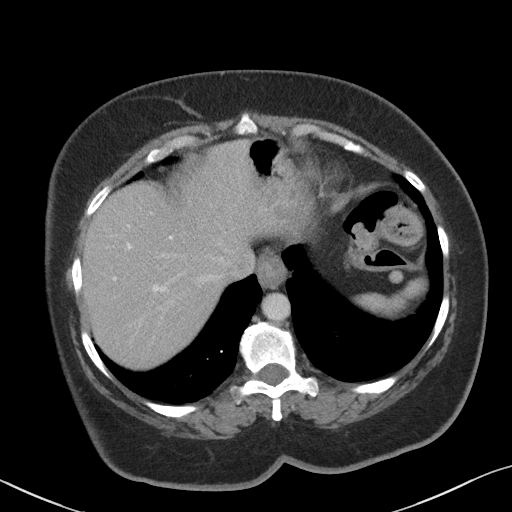
[im 97/103  soft-tissue]
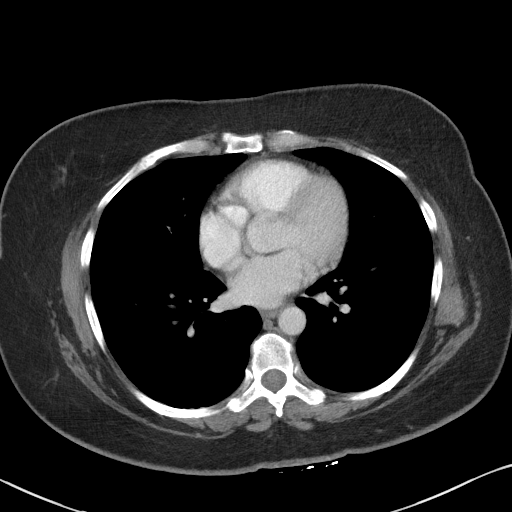

[Series 5: coronal st · coronal · 0.69mm/px · 3 of 90 slices shown]
[im 30/90  soft-tissue]
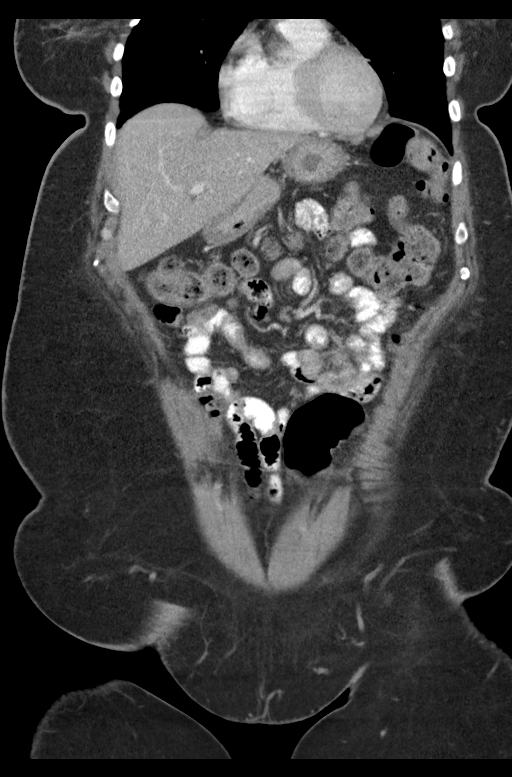
[im 40/90  soft-tissue]
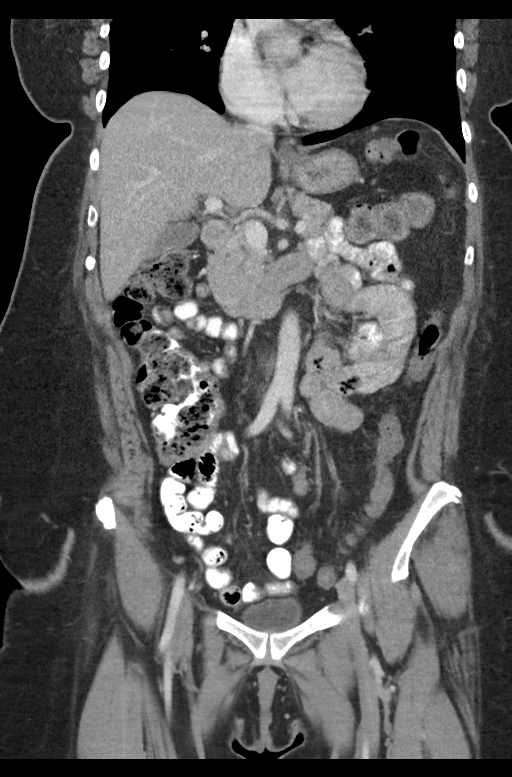
[im 50/90  soft-tissue]
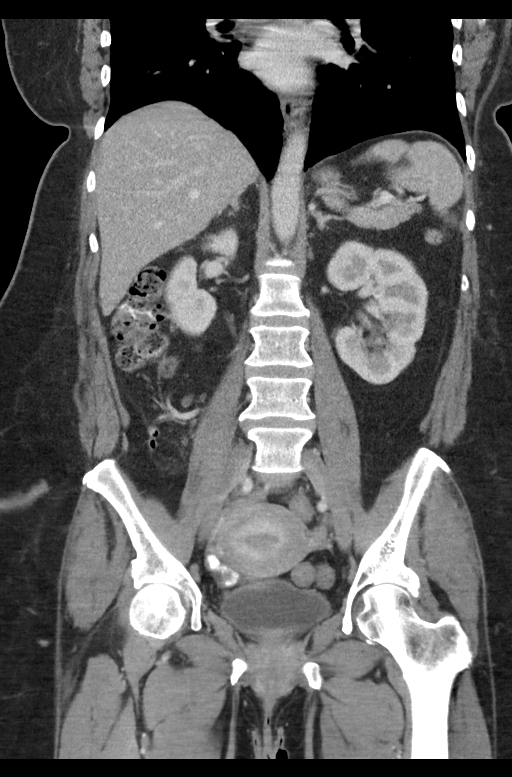

[15 of 46 positions shown; findings below may reference images not displayed]

FINDINGS: Lower chest: Within normal.

Hepatobiliary: Possible sludge versus subtle cholelithiasis. Subtle
gallbladder wall ill definition without adjacent inflammatory change
or fluid. Liver and biliary tree are normal.

Pancreas: Normal.

Spleen: Normal.

Adrenals/Urinary Tract: Adrenal glands are normal. Kidneys are
normal in size without hydronephrosis or nephrolithiasis.
Subcentimeter hypodensity over the anterior mid pole of the right
kidney too small to characterize but likely a cyst. Ureters and
bladder are normal.

Stomach/Bowel: Stomach and small bowel are within normal. Appendix
is normal. Distal descending and rectosigmoid colon is predominately
decompressed.

Vascular/Lymphatic: Within normal.

Reproductive: Within normal.

Other: Tiny amount of free fluid in the cul-de-sac. No focal
inflammatory change. Mesentery within normal.

Musculoskeletal: Minimal degenerate change of the spine.
IMPRESSION: Suggestion of mild gallbladder sludge versus subtle gallstones with
mild ill definition of the gallbladder wall without adjacent fluid
or inflammatory change. Consider right upper quadrant ultrasound for
further evaluation.

Subcentimeter hypodensity over the right kidney too small to
characterize, but likely a cyst.

## 2019-01-29 IMAGING — US US ABDOMEN LIMITED
1 series · 14 of 25 positions shown · non-contrast
Comparison: CT from earlier in the same day.

CLINICAL DATA: Abdominal pain

EXAM:
ULTRASOUND ABDOMEN LIMITED RIGHT UPPER QUADRANT

[Series 1: us abdomen limited · 0.19mm/px · 14 of 40 slices shown]
[im 1/40]
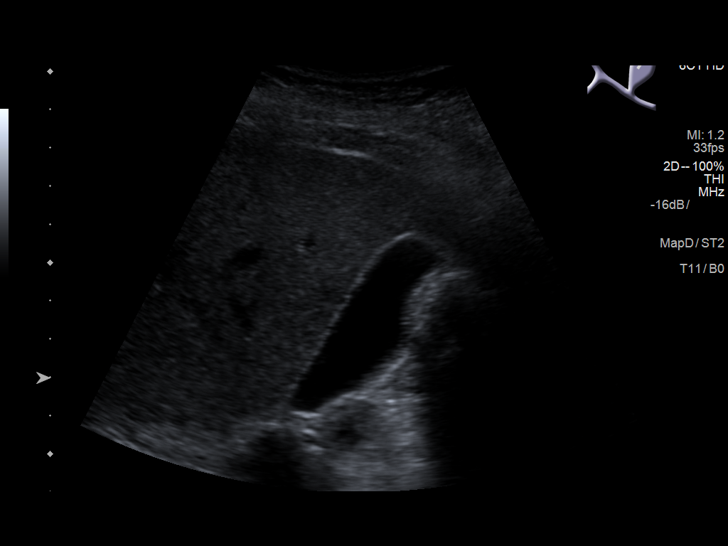
[im 4/40]
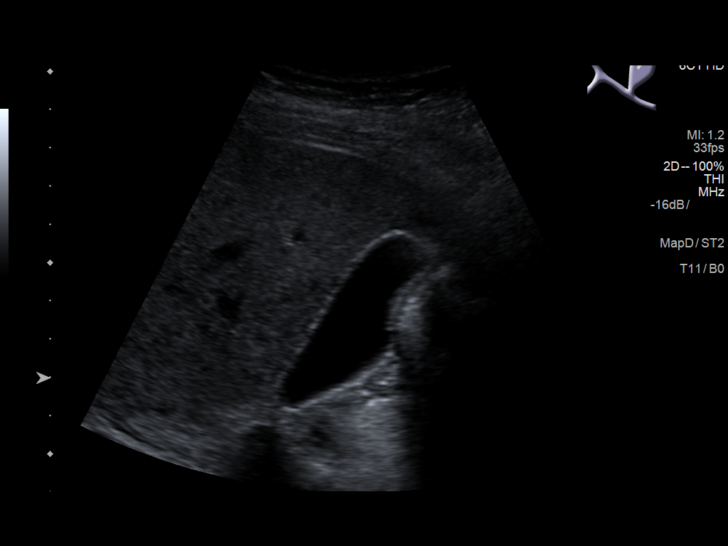
[im 7/40]
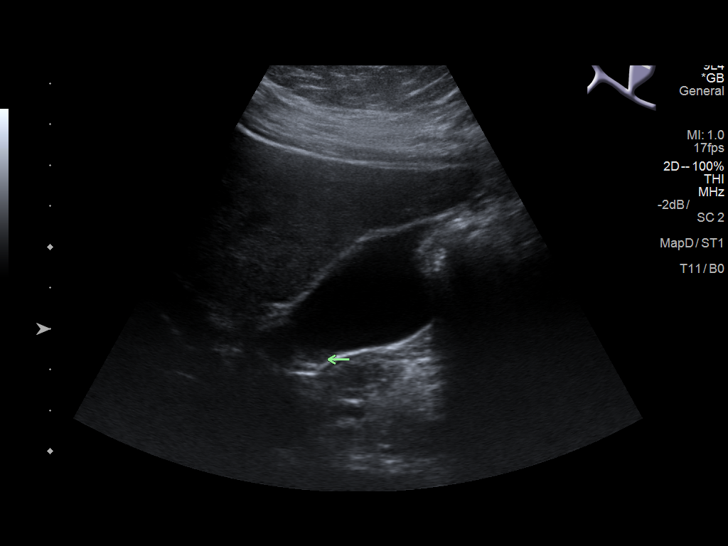
[im 10/40]
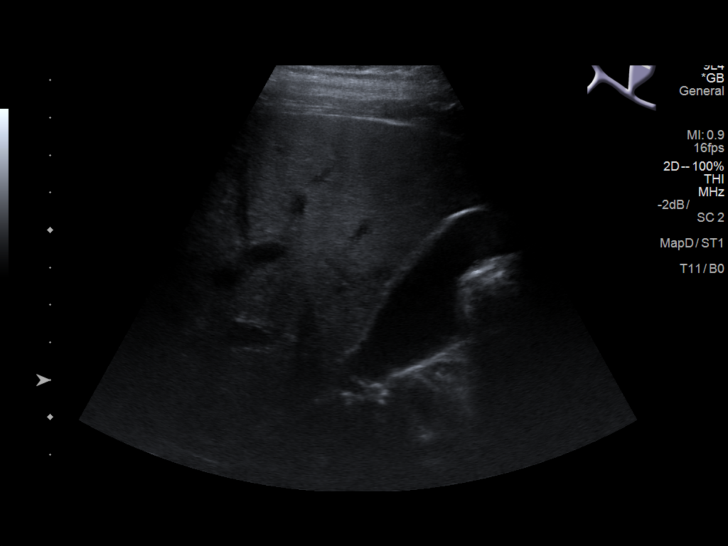
[im 14/40]
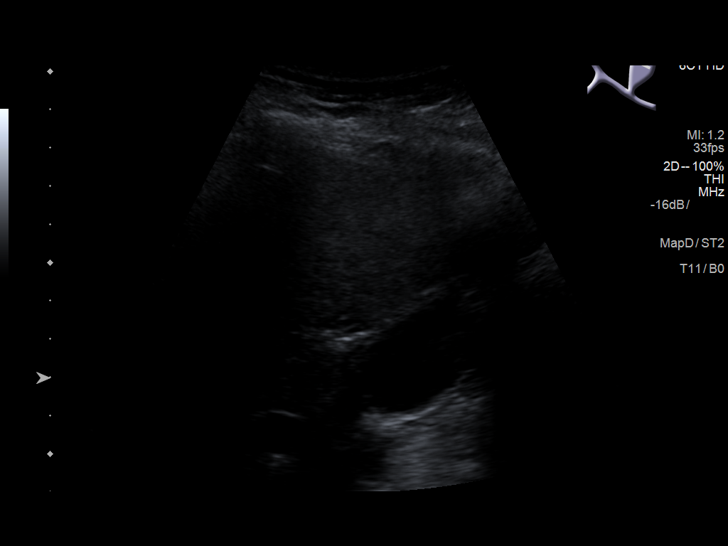
[im 15/40]
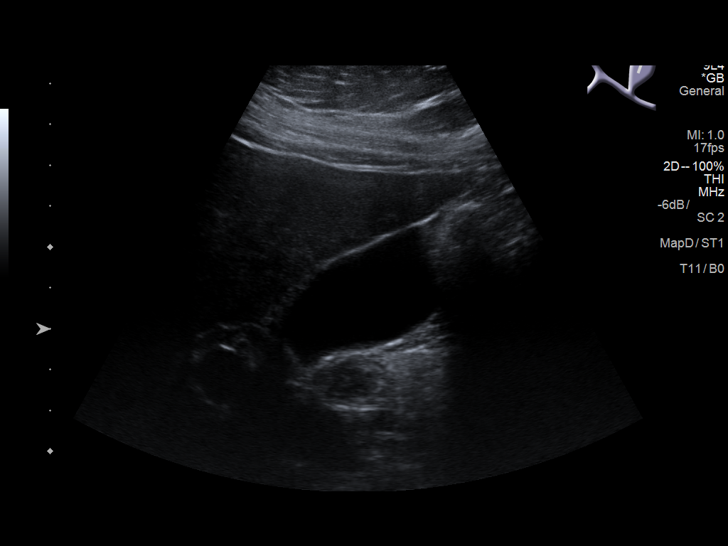
[im 18/40]
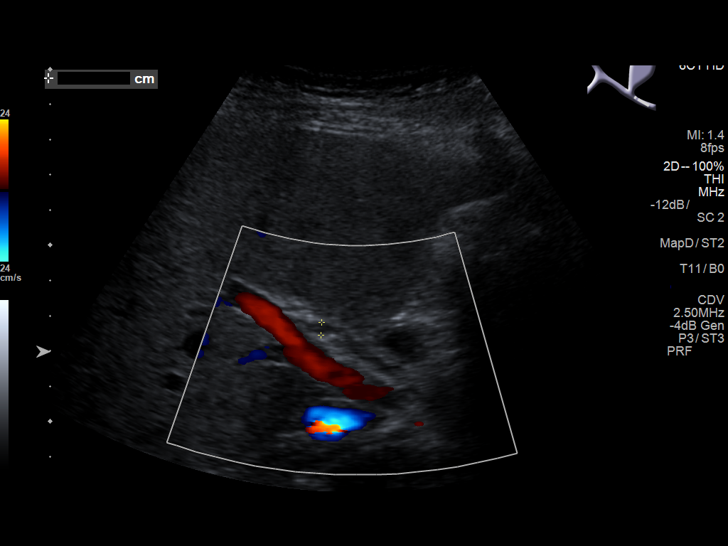
[im 22/40]
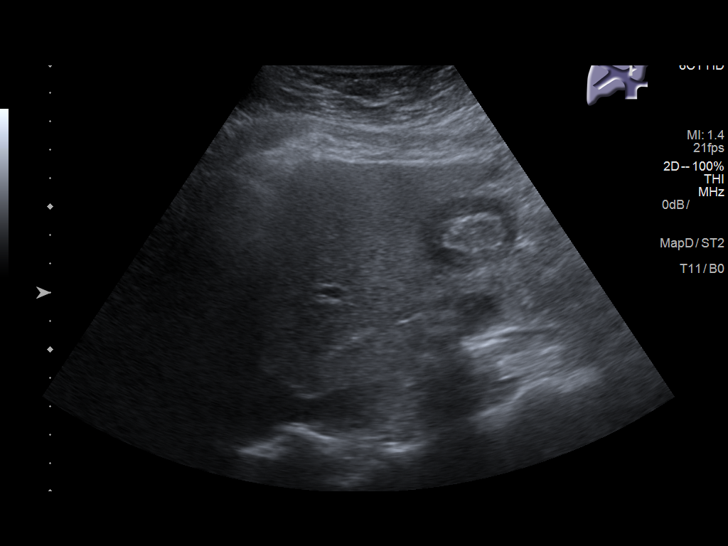
[im 25/40]
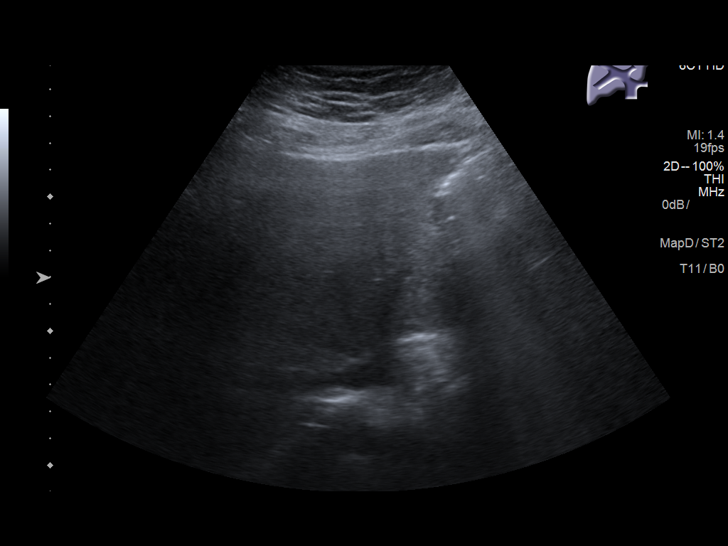
[im 27/40]
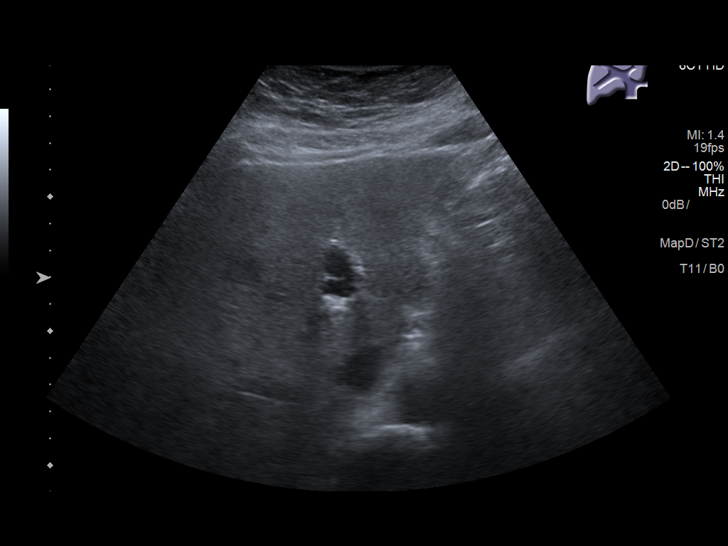
[im 30/40]
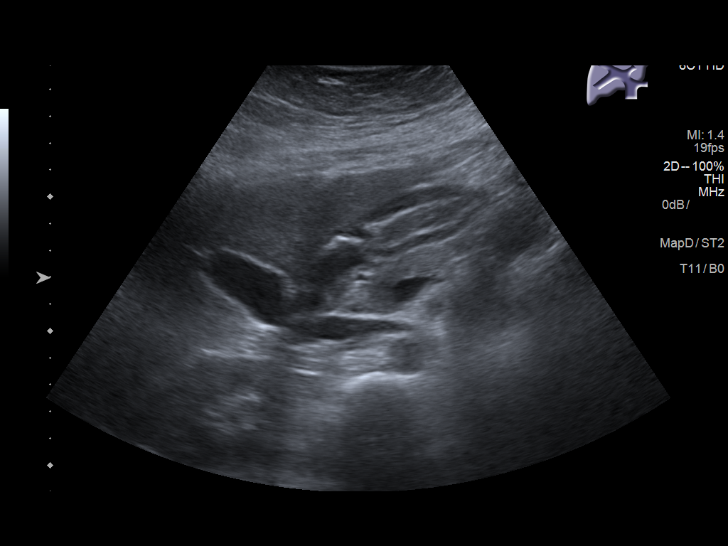
[im 33/40]
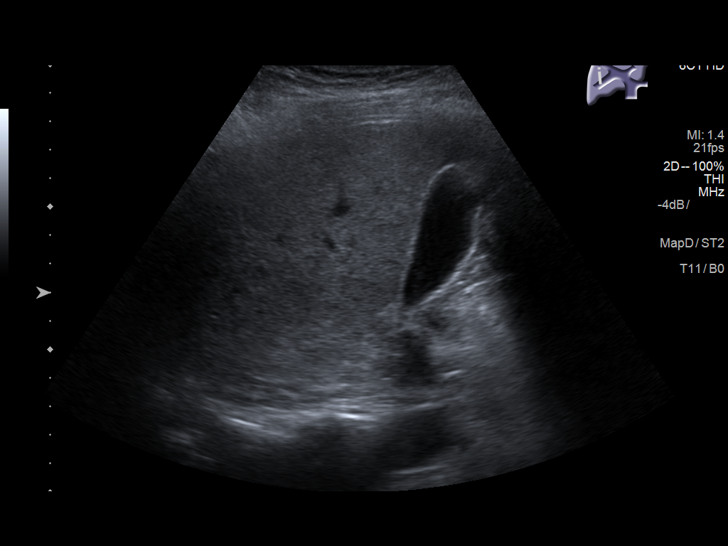
[im 36/40]
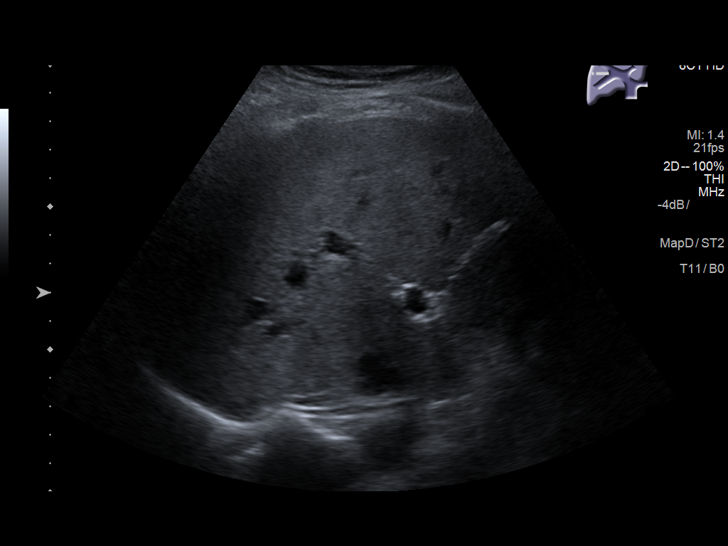
[im 40/40]
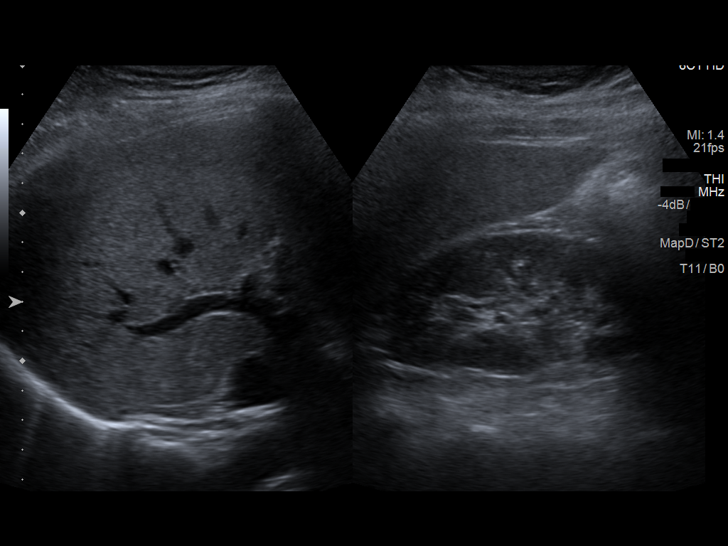

[14 of 25 positions shown; findings below may reference images not displayed]

FINDINGS: Gallbladder:

Dependent echogenic material is noted consistent with small stones
and gallbladder sludge. No wall thickening or pericholecystic fluid
is noted. Negative sonographic Murphy's sign is noted.

Common bile duct:

Diameter: 4 mm

Liver:

Mild fatty infiltration is noted.  No focal lesion is seen.
IMPRESSION: Small stones and sludge without complicating factors.

## 2019-02-21 ENCOUNTER — Ambulatory Visit: Payer: BC Managed Care – PPO

## 2021-03-21 NOTE — Progress Notes (Signed)
 SABRA

## 2023-12-27 ENCOUNTER — Other Ambulatory Visit: Payer: Self-pay

## 2023-12-27 ENCOUNTER — Emergency Department
Admission: EM | Admit: 2023-12-27 | Discharge: 2023-12-27 | Disposition: A | Discharge status: Home / Self Care | Attending: Emergency Medicine | Admitting: Emergency Medicine

## 2023-12-27 DIAGNOSIS — F4322 Adjustment disorder with anxiety: Secondary | ICD-10-CM | POA: Insufficient documentation

## 2023-12-27 DIAGNOSIS — F4321 Adjustment disorder with depressed mood: Secondary | ICD-10-CM

## 2023-12-27 DIAGNOSIS — D72819 Decreased white blood cell count, unspecified: Secondary | ICD-10-CM | POA: Insufficient documentation

## 2023-12-27 DIAGNOSIS — Z8542 Personal history of malignant neoplasm of other parts of uterus: Secondary | ICD-10-CM | POA: Diagnosis not present

## 2023-12-27 LAB — COMPREHENSIVE METABOLIC PANEL WITH GFR
ALT: 12 U/L (ref 0–44)
AST: 21 U/L (ref 15–41)
Albumin: 3.6 g/dL (ref 3.5–5.0)
Alkaline Phosphatase: 82 U/L (ref 38–126)
Anion gap: 9 (ref 5–15)
BUN: 10 mg/dL (ref 8–23)
CO2: 26 mmol/L (ref 22–32)
Calcium: 9 mg/dL (ref 8.9–10.3)
Chloride: 104 mmol/L (ref 98–111)
Creatinine, Ser: 0.71 mg/dL (ref 0.44–1.00)
GFR, Estimated: >60 mL/min (ref >60)
Glucose, Bld: 118 mg/dL — ABNORMAL HIGH (ref 70–99)
Potassium: 4 mmol/L (ref 3.5–5.1)
Sodium: 139 mmol/L (ref 135–145)
Total Bilirubin: 0.3 mg/dL (ref 0.0–1.2)
Total Protein: 7.1 g/dL (ref 6.5–8.1)

## 2023-12-27 LAB — CBC
HCT: 37.7 % (ref 36.0–46.0)
Hemoglobin: 12.3 g/dL (ref 12.0–15.0)
MCH: 29.3 pg (ref 26.0–34.0)
MCHC: 32.6 g/dL (ref 30.0–36.0)
MCV: 89.8 fL (ref 80.0–100.0)
Platelets: 242 K/uL (ref 150–400)
RBC: 4.2 MIL/uL (ref 3.87–5.11)
RDW: 14.4 % (ref 11.5–15.5)
WBC: 3.5 K/uL — ABNORMAL LOW (ref 4.0–10.5)
nRBC: 0 % (ref 0.0–0.2)

## 2023-12-27 LAB — URINE DRUG SCREEN
Amphetamines: NEGATIVE
Barbiturates: NEGATIVE
Benzodiazepines: NEGATIVE
Cocaine: NEGATIVE
Fentanyl: NEGATIVE
Methadone Scn, Ur: NEGATIVE
Opiates: NEGATIVE
Tetrahydrocannabinol: NEGATIVE

## 2023-12-27 LAB — ACETAMINOPHEN LEVEL: Acetaminophen (Tylenol), Serum: <10 ug/mL — ABNORMAL LOW (ref 10–30)

## 2023-12-27 LAB — ETHANOL: Alcohol, Ethyl (B): <15 mg/dL (ref <15)

## 2023-12-27 LAB — SALICYLATE LEVEL: Salicylate Lvl: <7 mg/dL — ABNORMAL LOW (ref 7.0–30.0)

## 2023-12-27 NOTE — BH Assessment (Signed)
 Comprehensive Clinical Assessment (CCA) Screening, Triage and Referral Note  12/27/2023 Sabrina Mccann [id number]  Chief Complaint:  Chief Complaint  Patient presents with   Drug Overdose   Visit Diagnosis: Depressive Disorder  Sabrina Mccann is a 63 year old female who presents to the ER due to texting her husband she took his medications to help her sleep. Per the patient, she has never text the husband anything like that and concerned. Patient further explain, she hasn't sleep well in a few days and his medications would cause her to relax enough to sleep. She took 3 of his 100mg  of Gabapentin. 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 Throughout the interview, the patient was calm, cooperative and pleasant. She was able to provide appropriate answers to the questions. She denies SI/HI and AV/H.   Patient Reported Information How did you hear about us ? No data recorded What Is the Reason for Your Visit/Call Today? Patient text husband she took his medications to help her sleep. He became concern and contacted 911  How Long Has This Been Causing You Problems? <Week  What Do You Feel Would Help You the Most Today? Treatment for Depression or other mood problem   Have You Recently Had Any Thoughts About Hurting Yourself? No  Are You Planning to Commit Suicide/Harm Yourself At This time? No   Have you Recently Had Thoughts About Hurting Someone [place]ral? No  Are You Planning to Harm Someone at This Time? No  Explanation: No data recorded  Have You Used Any Alcohol or Drugs in the Past 24 Hours? No  How Long Ago Did You Use Drugs or Alcohol? No data recorded What Did You Use and How Much? No data recorded  Do You Currently Have a Therapist/Psychiatrist? No  Name of Therapist/Psychiatrist: No data recorded  Have You Been Recently Discharged From Any Office Practice or Programs? No  Explanation of Discharge From Practice/Program: No data  recorded   CCA Screening Triage Referral Assessment Type of Contact: Face-to-Face  Telemedicine Service Delivery:   Is this Initial or Reassessment?   Date Telepsych consult ordered in [facility]:    Time Telepsych consult ordered in [facility]:    Location of Assessment: [facility] ED  Provider Location: [facility] ED    Collateral Involvement: No data recorded  Does Patient Have a Court Appointed Legal Guardian? No data recorded Name and Contact of Legal Guardian: No data recorded If Minor and Not Living with Parent(s), Who has Custody? No data recorded Is CPS involved or ever been involved? Never  Is APS involved or ever been involved? Never   Patient Determined To Be At Risk for Harm To Self or Others Based on Review of Patient Reported Information or Presenting Complaint? No  Method: No data recorded Availability of Means: No data recorded Intent: No data recorded Notification Required: No data recorded Additional Information for Danger to Others Potential: No data recorded Additional Comments for Danger to Others Potential: No data recorded Are There Guns or Other Weapons in Your Home? No data recorded Types of Guns/Weapons: No data recorded Are These Weapons Safely Secured?                            No data recorded Who Could Verify You Are Able To Have These Secured: No data recorded Do You Have any Outstanding Charges, Pending Court Dates, Parole/Probation? No data recorded Contacted To Inform of Risk of Harm To  Self or Others: No data recorded  Does Patient Present under Involuntary Commitment? No   [place] of Residence: [place]   Patient Currently Receiving the Following Services: Not Receiving Services   Determination of Need: Emergent (2 hours)   Options For Referral: ED Visit   Disposition Recommendation per psychiatric provider: There are no psychiatric contraindications to discharge at this time  Kiki DOROTHA Barge MS, LCAS, [facility], [facility] Therapeutic Triage  Specialist 12/27/2023 2:16 PM

## 2023-12-27 NOTE — ED Triage Notes (Signed)
 Pt to ED [facility] from home, pt took 300mg  gabapentin at 0515. Called husband and told him she loved him, pt stated she just wanted to sleep. Pt denies intent to harm self, states just wanted to sleep

## 2023-12-27 NOTE — ED Notes (Signed)
 Lab called for blood draw.

## 2023-12-27 NOTE — ED Provider Notes (Signed)
 [facility] Provider Note   Event Date/Time   First MD Initiated Contact with Patient 12/27/23 [phone]     (approximate)  History   Drug Overdose  HPI  Sabrina Mccann is a 63 y.o. female reports previous history of uterine cancer status post hysterectomy.  Patient reports cancer free.  She she sees a small primary care in [place] but reports she has no active medical problems her husband affirms the same.  She takes no prescriptions she has no allergies.  She and her husband recently moved back into the [place] area few weeks ago since then she has been struggling with low mood difficulty sleeping anxiety.  Reports the place they are living is causing some issues.  This morning she sent a unusual unexpected message to her husband telling him that she had taken 300 mg of gabapentin, which is his medicine, and that she just needed to sleep.  She reports that she has no suicidal intent or desire to hurt herself or anyone else.  She reports that she just feels like she cannot sleep she feels very anxious and is struggling with her new situation and some other life stressors.  Denies any recent illness.  She and her husband both report she has no known history of mental illness.  She has been wondering if she may have depression like symptoms now for a little while although     Physical Exam   Triage Vital Signs: ED Triage Vitals  Encounter Vitals Group     BP 12/27/23 0708 (!) 141/57     Girls Systolic BP Percentile --      Girls Diastolic BP Percentile --      Boys Systolic BP Percentile --      Boys Diastolic BP Percentile --      Pulse Rate 12/27/23 0708 92     Resp 12/27/23 0708 18     Temp 12/27/23 0708 98 F (36.7 C)     Temp src --      SpO2 12/27/23 0708 100 %     Weight 12/27/23 0706 220 lb (99.8 kg)     Height 12/27/23 0706 5' 5 (1.651 m)     Head Circumference --      Peak Flow --      Pain Score 12/27/23 0706 0     Pain Loc --       Pain Education --      Exclude from Growth Chart --     Most recent vital signs: Vitals:   12/27/23 0708 12/27/23 0849  BP: (!) 141/57   Pulse: 92   Resp: 18   Temp: 98 F (36.7 C)   SpO2: 100% 100%   She denies any other coingestants, she reports only took 300 mg of gabapentin of her husbands in an effort to fall asleep as she could not sleep all night  General: Awake, no distress.  CV:  Good peripheral perfusion.  Normal tone Resp:  Normal effort.  Clear bilateral Abd:  No distention.  Nontender Other:  Normal motor movements.  No tremors.  Facial expressions are normal fully alert well-oriented.  No nystagmus.   ED Results / Procedures / Treatments   Labs (all labs ordered are listed, but only abnormal results are displayed) Labs Reviewed  COMPREHENSIVE METABOLIC PANEL WITH GFR - Abnormal; Notable for the following components:      Result Value   Glucose, Bld 118 (*)    All other components within normal  limits  CBC - Abnormal; Notable for the following components:   WBC 3.5 (*)    All other components within normal limits  ACETAMINOPHEN  LEVEL - Abnormal; Notable for the following components:   Acetaminophen  (Tylenol ), Serum <10 (*)    All other components within normal limits  SALICYLATE LEVEL - Abnormal; Notable for the following components:   Salicylate Lvl <7.0 (*)    All other components within normal limits  ETHANOL  URINE DRUG SCREEN  URINE DRUG SCREEN     EKG  Inter by me at 9 AM heart rate 80 QRS 70 QTc 450 Normal sinus rhythm no evidence of acute ischemia.  Possible old anterior infarct.  No symptoms to correlate with any sort of recent sign or symptom of infarction   RADIOLOGY     PROCEDURES:  Critical Care performed: No  Procedures   MEDICATIONS ORDERED IN ED: Medications - No data to display   IMPRESSION / MDM / ASSESSMENT AND PLAN / ED COURSE  I reviewed the triage vital signs and the nursing notes.                               Differential diagnosis includes, but is not limited to, anxiety depression restlessness insomnia, adjustment disorder etc.  Patient does not seem to have any genuine concern for suicidal ideation but some elements of the history are sort of subtly suggestive of a passive just wanted to sleep and also took a prescription that was not hers.  At this juncture she is interested in seeing psychiatry, I will keep her voluntary as she has no obvious high risk of suicidal ideation her exam is normal she is resting comfortably based on the low-dose of gabapentin taken I think would be doubtful that she will have downward side effect but we will continue to observe her for mental status etc.  I have placed consult with TTS and psychiatry  Labs today CBC normal except for slightly very slight diminished white count.  Normal comprehensive metabolic panel.  Salicylate level normal acetaminophen  level normal.  Negative drug screen  Patient's presentation is most consistent with acute complicated illness / injury requiring diagnostic workup.  ----------------------------------------- 1:28 PM on 12/27/2023 ----------------------------------------- Patient resting comfortably.  Has been seen evaluate by psychiatry Dr. Gail.  He has communicated directly with me advising the patient can be discharged and follow-up with outpatient resources that psychiatry is provided. Patient can be discharged withher husband with outpatient follow up   Return precautions and treatment recommendations and follow-up discussed with the patient who is agreeable with the plan.         FINAL CLINICAL IMPRESSION(S) / ED DIAGNOSES   Final diagnoses:  Adjustment disorder with anxiety     Rx / DC Orders   ED Discharge Orders     None        Note:  This document was prepared using Dragon voice recognition software and may include unintentional dictation errors.   Dicky Anes, MD 12/27/23 1329

## 2023-12-27 NOTE — Discharge Instructions (Addendum)
 You have been seen in the Emergency Department (ED) today for a psychiatric complaint.  You have been evaluated by psychiatry and we believe you are safe to be discharged from the hospital.    Please do not take any medications that are not specifically prescribed to you and use only as prescribed.  Please return to the ED immediately if you have ANY thoughts of hurting yourself or anyone else, so that we may help you.  Please avoid alcohol and drug use.  Follow up with your doctor and/or therapist as soon as possible regarding today's ED visit.   Please follow up any other recommendations and clinic appointments provided by the psychiatry team that saw you in the Emergency Department.

## 2023-12-27 NOTE — Consult Note (Signed)
 Patient is a 63 year old African-American female who no significant psychiatric history patient reportedly lives with her husband she reportedly took couple of tablets of Neurontin and her husband was concerned that she took the pills and was sent here patient reports that she is overwhelmed with life with financial stressors and her mother being in a nursing home and recent stroke for the mother patient reports that she was not sleeping well and wanted to sleep and rest she denies any suicidal and homicidal thoughts no symptoms of mania hypomania psychosis I spoke to her husband who feels comfortable with her discharge.  Mental status examination patient is a 63 year old female who appears stated age alert oriented x 3 denies any suicidal thoughts Insight and judgment fair.  Assessment adjustment disorder with depressed mood  Plan patient is psychiatrically stable for discharge to outpatient follow-up.

## 2023-12-27 NOTE — ED Notes (Signed)
 Patient sleeping
